# Patient Record
Sex: Female | Born: 1985 | Race: Black or African American | Hispanic: No | Marital: Single | Smoking: Current every day smoker
Health system: Southern US, Community
[De-identification: ages and names within clinical notes are randomized; demographics above are authoritative.]

## PROBLEM LIST (undated history)

## (undated) DIAGNOSIS — F419 Anxiety disorder, unspecified: Secondary | ICD-10-CM

---

## 2011-06-22 ENCOUNTER — Emergency Department (HOSPITAL_COMMUNITY)
Admission: EM | Admit: 2011-06-22 | Discharge: 2011-06-22 | Disposition: A | Discharge status: Home / Self Care | Payer: Self-pay | Attending: Emergency Medicine | Admitting: Emergency Medicine

## 2011-06-22 DIAGNOSIS — K006 Disturbances in tooth eruption: Secondary | ICD-10-CM | POA: Insufficient documentation

## 2011-06-22 DIAGNOSIS — K089 Disorder of teeth and supporting structures, unspecified: Secondary | ICD-10-CM | POA: Insufficient documentation

## 2012-01-27 ENCOUNTER — Emergency Department (HOSPITAL_COMMUNITY)
Admission: EM | Admit: 2012-01-27 | Discharge: 2012-01-27 | Disposition: A | Discharge status: Home / Self Care | Payer: BC Managed Care – PPO | Attending: Emergency Medicine | Admitting: Emergency Medicine

## 2012-01-27 ENCOUNTER — Encounter (HOSPITAL_COMMUNITY): Payer: Self-pay | Admitting: *Deleted

## 2012-01-27 ENCOUNTER — Emergency Department (HOSPITAL_COMMUNITY): Payer: BC Managed Care – PPO

## 2012-01-27 DIAGNOSIS — S63609A Unspecified sprain of unspecified thumb, initial encounter: Secondary | ICD-10-CM

## 2012-01-27 DIAGNOSIS — R296 Repeated falls: Secondary | ICD-10-CM | POA: Insufficient documentation

## 2012-01-27 DIAGNOSIS — M79609 Pain in unspecified limb: Secondary | ICD-10-CM | POA: Insufficient documentation

## 2012-01-27 DIAGNOSIS — S6390XA Sprain of unspecified part of unspecified wrist and hand, initial encounter: Secondary | ICD-10-CM | POA: Insufficient documentation

## 2012-01-27 MED ORDER — OXYCODONE-ACETAMINOPHEN 5-325 MG PO TABS
2.0000 | ORAL_TABLET | Freq: Once | ORAL | Status: AC
  Administered 2012-01-27: 2 via ORAL
  Filled 2012-01-27: qty 2

## 2012-01-27 MED ORDER — IBUPROFEN 800 MG PO TABS: 800.0000 mg | ORAL_TABLET | Freq: Three times a day (TID) | ORAL | Status: DC

## 2012-01-27 MED ORDER — OXYCODONE-ACETAMINOPHEN 5-325 MG PO TABS: 1.0000 | ORAL_TABLET | ORAL | Status: AC | PRN
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
## 2012-01-27 NOTE — Discharge Instructions (Signed)

## 2013-03-27 ENCOUNTER — Emergency Department (HOSPITAL_COMMUNITY)
Admission: EM | Admit: 2013-03-27 | Discharge: 2013-03-27 | Disposition: A | Discharge status: Home / Self Care | Payer: BC Managed Care – PPO | Attending: Emergency Medicine | Admitting: Emergency Medicine

## 2013-03-27 ENCOUNTER — Encounter (HOSPITAL_COMMUNITY): Payer: Self-pay | Admitting: Emergency Medicine

## 2013-03-27 DIAGNOSIS — Z8659 Personal history of other mental and behavioral disorders: Secondary | ICD-10-CM | POA: Insufficient documentation

## 2013-03-27 DIAGNOSIS — Z3202 Encounter for pregnancy test, result negative: Secondary | ICD-10-CM | POA: Insufficient documentation

## 2013-03-27 DIAGNOSIS — B9689 Other specified bacterial agents as the cause of diseases classified elsewhere: Secondary | ICD-10-CM

## 2013-03-27 DIAGNOSIS — R319 Hematuria, unspecified: Secondary | ICD-10-CM | POA: Insufficient documentation

## 2013-03-27 DIAGNOSIS — N760 Acute vaginitis: Secondary | ICD-10-CM | POA: Insufficient documentation

## 2013-03-27 DIAGNOSIS — N898 Other specified noninflammatory disorders of vagina: Secondary | ICD-10-CM | POA: Insufficient documentation

## 2013-03-27 DIAGNOSIS — R300 Dysuria: Secondary | ICD-10-CM | POA: Insufficient documentation

## 2013-03-27 DIAGNOSIS — R109 Unspecified abdominal pain: Secondary | ICD-10-CM | POA: Insufficient documentation

## 2013-03-27 DIAGNOSIS — N390 Urinary tract infection, site not specified: Secondary | ICD-10-CM | POA: Insufficient documentation

## 2013-03-27 HISTORY — DX: Anxiety disorder, unspecified: F41.9

## 2013-03-27 LAB — URINE MICROSCOPIC-ADD ON

## 2013-03-27 LAB — WET PREP, GENITAL
Trich, Wet Prep: NONE SEEN
Yeast Wet Prep HPF POC: NONE SEEN

## 2013-03-27 LAB — URINALYSIS, ROUTINE W REFLEX MICROSCOPIC
Bilirubin Urine: NEGATIVE
Glucose, UA: NEGATIVE mg/dL
Hgb urine dipstick: NEGATIVE
Nitrite: NEGATIVE
Specific Gravity, Urine: 1.029 (ref 1.005–1.030)
pH: 6 (ref 5.0–8.0)

## 2013-03-27 MED ORDER — METRONIDAZOLE 500 MG PO TABS: 500.0000 mg | ORAL_TABLET | Freq: Two times a day (BID) | ORAL | Status: DC

## 2013-03-27 MED ORDER — CEFTRIAXONE SODIUM 1 G IJ SOLR
1.0000 g | Freq: Once | INTRAMUSCULAR | Status: AC
  Administered 2013-03-27: 1 g via INTRAMUSCULAR
  Filled 2013-03-27: qty 10

## 2013-03-27 MED ORDER — OXYCODONE-ACETAMINOPHEN 5-325 MG PO TABS
2.0000 | ORAL_TABLET | Freq: Once | ORAL | Status: AC
  Administered 2013-03-27: 2 via ORAL
  Filled 2013-03-27: qty 2

## 2013-03-27 MED ORDER — HYDROCODONE-ACETAMINOPHEN 5-325 MG PO TABS: 1.0000 | ORAL_TABLET | ORAL | Status: DC | PRN

## 2013-03-27 MED ORDER — CEPHALEXIN 500 MG PO CAPS: 500.0000 mg | ORAL_CAPSULE | Freq: Four times a day (QID) | ORAL | Status: DC

## 2013-03-27 MED ORDER — LIDOCAINE HCL (PF) 1 % IJ SOLN
INTRAMUSCULAR | Status: AC
  Administered 2013-03-27: 5 mL
  Filled 2013-03-27: qty 5

## 2013-03-27 NOTE — ED Provider Notes (Signed)
CSN: [id number]     Arrival date & time 03/27/13  0865 History     First MD Initiated Contact with Patient 03/27/13 [phone]     Chief Complaint  Patient presents with  . Urinary Frequency   (Consider location/radiation/quality/duration/timing/severity/associated sxs/prior Treatment) HPI Vickie Lucas is a(n) 27 y.o. female who presents with chief complaint of dysuria, bilateral flank pain, suprapubic pain worsening over the past week.  The patient was treated for urinary tract infection with Cipro 2 months ago and states that her symptoms did not fully resolve.  she feels she complains of foul vaginal odor of the past week. Denies fevers, chills, myalgias, arthralgias. Denies DOE, SOB, chest tightness or pressure, radiation to left arm, jaw or back, or diaphoresis.. Denies headaches, light headedness, weakness, visual disturbances. Denies abdominal pain, nausea, vomiting, diarrhea or constipation.    Past Medical History  Diagnosis Date  . Anxiety    No past surgical history on file. No family history on file. History  Substance Use Topics  . Smoking status: Never Smoker   . Smokeless tobacco: Not on file  . Alcohol Use: No   OB History   Grav Para Term Preterm Abortions TAB SAB Ect Mult Living                 Review of Systems  Constitutional: Negative for fever and chills.  HENT: Negative for trouble swallowing.   Respiratory: Negative for shortness of breath.   Cardiovascular: Negative for chest pain.  Gastrointestinal: Negative for nausea, vomiting, abdominal pain, diarrhea and constipation.  Genitourinary: Positive for dysuria, hematuria, flank pain and vaginal discharge.  Musculoskeletal: Negative for myalgias and arthralgias.  Skin: Negative for rash.  Neurological: Negative for numbness.  All other systems reviewed and are negative.    Allergies  Ibuprofen  Home Medications  No current outpatient prescriptions on file. BP 132/86  Pulse 88  Temp(Src) 98.9 F  (37.2 C) (Oral)  Resp 18  SpO2 100%  LMP 02/25/2013 Physical Exam  Constitutional: She is oriented to person, place, and time. She appears well-developed and well-nourished. No distress.  HENT:  Head: Normocephalic and atraumatic.  Eyes: Conjunctivae are normal. No scleral icterus.  Neck: Normal range of motion.  Cardiovascular: Normal rate, regular rhythm and normal heart sounds.  Exam reveals no gallop and no friction rub.   No murmur heard. Pulmonary/Chest: Effort normal and breath sounds normal. No respiratory distress.  Abdominal: Soft. Bowel sounds are normal. She exhibits no distension and no mass. There is tenderness. There is no guarding.  Positive suprapubic and bilateral CVA tenderness  Genitourinary:  Pelvic exam: normal external genitalia, vulva, vagina, cervix, uterus and adnexa. Patient had generalized tenderness and thick, creamy vaginal discharge   Neurological: She is alert and oriented to person, place, and time.  Skin: Skin is warm and dry. She is not diaphoretic.    ED Course   Procedures (including critical care time)  Labs Reviewed  WET PREP, GENITAL - Abnormal; Notable for the following:    Clue Cells Wet Prep HPF POC MANY (*)    WBC, Wet Prep HPF POC MANY (*)    All other components within normal limits  URINALYSIS, ROUTINE W REFLEX MICROSCOPIC - Abnormal; Notable for the following:    APPearance CLOUDY (*)    Leukocytes, UA MODERATE (*)    All other components within normal limits  URINE MICROSCOPIC-ADD ON - Abnormal; Notable for the following:    Squamous Epithelial / LPF MANY (*)  Bacteria, UA FEW (*)    All other components within normal limits  GC/CHLAMYDIA PROBE AMP  URINE CULTURE  PREGNANCY, URINE   No results found. 1. UTI (lower urinary tract infection)   2. BV (bacterial vaginosis)     MDM  Pt has been diagnosed with a UTI and BV. Pt is afebrile, no CVA tenderness, normotensive, and denies N/V. Pt may have an early pyelonephritis,  however she is afebrile and does not appear toxic. Patient given IM rocephin, d/c with flagyl/ keflex.Pt to be dc home with antibiotics and instructions to follow up with PCP if symptoms persist.   Arthor Captain, PA-C 03/27/13 1155

## 2013-03-27 NOTE — ED Provider Notes (Signed)
Medical screening examination/treatment/procedure(s) were performed by non-physician practitioner and as supervising physician I was immediately available for consultation/collaboration.  Glynn Octave, MD 03/27/13 [phone]

## 2013-03-27 NOTE — ED Notes (Signed)
PT has hx of stubborn UTI's that do not respond to cipro. Reports her vagina smells funny, like ammonia. Most recent UTI was 2 mos ago. Pain with urination and frequency.

## 2013-03-27 NOTE — ED Notes (Signed)
Pt undressed and in a gown at this time 

## 2013-03-28 LAB — GC/CHLAMYDIA PROBE AMP: CT Probe RNA: NEGATIVE

## 2013-04-04 LAB — URINE CULTURE

## 2013-04-05 ENCOUNTER — Telehealth (HOSPITAL_COMMUNITY): Payer: Self-pay | Admitting: Emergency Medicine

## 2013-04-05 NOTE — ED Notes (Signed)
Post ED Visit - Positive Culture Follow-up  Culture report reviewed by antimicrobial stewardship pharmacist: []  Wes Dulaney, Pharm.D., BCPS []  Celedonio Miyamoto, [address].D., BCPS []  Georgina Pillion, [address].D., BCPS []  [place], [place].D., BCPS, AAHIVP [x]  Estella Husk, Pharm.D., BCPS, AAHIVP  Positive urine culture Treated with Keflex, organism sensitive to the same and no further patient follow-up is required at this time.  Kylie A Holland 04/05/2013, 5:03 PM

## 2013-04-14 ENCOUNTER — Emergency Department (HOSPITAL_COMMUNITY)
Admission: EM | Admit: 2013-04-14 | Discharge: 2013-04-14 | Disposition: A | Discharge status: Home / Self Care | Payer: BC Managed Care – PPO | Attending: Emergency Medicine | Admitting: Emergency Medicine

## 2013-04-14 ENCOUNTER — Encounter (HOSPITAL_COMMUNITY): Payer: Self-pay | Admitting: Emergency Medicine

## 2013-04-14 DIAGNOSIS — L293 Anogenital pruritus, unspecified: Secondary | ICD-10-CM | POA: Insufficient documentation

## 2013-04-14 DIAGNOSIS — Z8659 Personal history of other mental and behavioral disorders: Secondary | ICD-10-CM | POA: Insufficient documentation

## 2013-04-14 DIAGNOSIS — N898 Other specified noninflammatory disorders of vagina: Secondary | ICD-10-CM | POA: Insufficient documentation

## 2013-04-14 DIAGNOSIS — R300 Dysuria: Secondary | ICD-10-CM | POA: Insufficient documentation

## 2013-04-14 DIAGNOSIS — R197 Diarrhea, unspecified: Secondary | ICD-10-CM | POA: Insufficient documentation

## 2013-04-14 LAB — URINALYSIS, ROUTINE W REFLEX MICROSCOPIC
Ketones, ur: NEGATIVE mg/dL
Nitrite: NEGATIVE
Protein, ur: NEGATIVE mg/dL
pH: 5.5 (ref 5.0–8.0)

## 2013-04-14 LAB — WET PREP, GENITAL: Clue Cells Wet Prep HPF POC: NONE SEEN

## 2013-04-14 MED ORDER — FLUCONAZOLE 100 MG PO TABS
150.0000 mg | ORAL_TABLET | Freq: Once | ORAL | Status: AC
  Administered 2013-04-14: 150 mg via ORAL
  Filled 2013-04-14: qty 2

## 2013-04-14 MED ORDER — HYDROCODONE-ACETAMINOPHEN 5-325 MG PO TABS
1.0000 | ORAL_TABLET | Freq: Once | ORAL | Status: AC
  Administered 2013-04-14: 1 via ORAL
  Filled 2013-04-14: qty 1

## 2013-04-14 NOTE — ED Provider Notes (Signed)
CSN: [id number]     Arrival date & time 04/14/13  1134 History     First MD Initiated Contact with Patient 04/14/13 1155     Chief Complaint  Patient presents with  . Pelvic Pain   (Consider location/radiation/quality/duration/timing/severity/associated sxs/prior Treatment) HPI Comments: Patient is a 27 year old female past medical history significant for anxiety presented to the emergency department for vaginal itching and nonbloody diarrhea that began 3 days ago. Patient states she's seen 2 weeks ago for urinary tract infection and bacterial vaginosis at which time she was given antibiotics. Patient states she has been taking antibiotics as prescribed and feels as though her symptoms may be related to that, as patient states she is prone to get vaginal yeast infections on antibiotics. Patient verses some crampy lower abdominal pain prior to onset of diarrhea but otherwise no abdominal pain. She denies any fevers, chills, nausea, vomiting. Patient endorses using a Montistat vaginal suppository PTA.   Patient is a 27 y.o. female presenting with pelvic pain.  Pelvic Pain Pertinent negatives include no abdominal pain, chills, fever, nausea or vomiting.    Past Medical History  Diagnosis Date  . Anxiety    History reviewed. No pertinent past surgical history. No family history on file. History  Substance Use Topics  . Smoking status: Never Smoker   . Smokeless tobacco: Not on file  . Alcohol Use: No   OB History   Grav Para Term Preterm Abortions TAB SAB Ect Mult Living                 Review of Systems  Constitutional: Negative for fever and chills.  HENT: Negative.   Eyes: Negative.   Respiratory: Negative.   Cardiovascular: Negative.   Gastrointestinal: Positive for diarrhea. Negative for nausea, vomiting, abdominal pain, constipation, blood in stool, abdominal distention, anal bleeding and rectal pain.  Genitourinary: Positive for dysuria and vaginal discharge. Negative for  vaginal bleeding.       Vaginal itching  Skin: Negative.   Neurological: Negative.     Allergies  Ibuprofen  Home Medications   Current Outpatient Rx  Name  Route  Sig  Dispense  Refill  . cephALEXin (KEFLEX) 500 MG capsule   Oral   Take 500 mg by mouth 4 (four) times daily. Started on 03-27-13 - 10 day course         . PRESCRIPTION MEDICATION   Oral   Take 1 tablet by mouth daily. Birth Control Tablets         . Tioconazole (MONISTAT 1-DAY VA)   Vaginal   Place 1 application vaginally once.          BP 139/76  Pulse 60  Temp(Src) 97.8 F (36.6 C) (Oral)  Resp 16  SpO2 97%  LMP 02/25/2013 Physical Exam  Constitutional: She is oriented to person, place, and time. She appears well-developed and well-nourished. No distress.  HENT:  Head: Normocephalic and atraumatic.  Right Ear: External ear normal.  Left Ear: External ear normal.  Nose: Nose normal.  Mouth/Throat: Oropharynx is clear and moist.  Eyes: Conjunctivae are normal.  Neck: Neck supple.  Cardiovascular: Normal rate, regular rhythm, normal heart sounds and intact distal pulses.   Pulmonary/Chest: Effort normal and breath sounds normal.  Abdominal: Soft. Bowel sounds are normal. There is no tenderness.  Neurological: She is alert and oriented to person, place, and time.  Skin: Skin is warm and dry. She is not diaphoretic.   Exam performed by Silvestre Moment, Sherida Dobkins L,  exam chaperoned Date: 04/14/2013 Pelvic exam: normal external genitalia without evidence of trauma. VULVA: normal appearing vulva with no masses, tenderness or lesion. VAGINA: normal appearing vagina with normal color and discharge, no lesions. CERVIX: normal appearing cervix without lesions, cervical motion tenderness absent, cervical os closed with out purulent discharge; vaginal discharge - white and copious (likely monistat, but difficult to differentiate), Wet prep and DNA probe for chlamydia and GC obtained.   ADNEXA: normal adnexa  in size, nontender and no masses UTERUS: uterus is normal size, shape, consistency and nontender.   ED Course   Procedures (including critical care time)  Labs Reviewed  WET PREP, GENITAL - Abnormal; Notable for the following:    WBC, Wet Prep HPF POC FEW (*)    All other components within normal limits  URINALYSIS, ROUTINE W REFLEX MICROSCOPIC - Abnormal; Notable for the following:    APPearance CLOUDY (*)    Leukocytes, UA MODERATE (*)    All other components within normal limits  URINE MICROSCOPIC-ADD ON - Abnormal; Notable for the following:    Squamous Epithelial / LPF MANY (*)    Bacteria, UA FEW (*)    All other components within normal limits  GC/CHLAMYDIA PROBE AMP  URINE CULTURE   No results found. 1. Vaginal itching     MDM  Pt afebrile, AAOx4, non-toxic, NAD presenting for vaginal itching and diarrhea after starting Keflex and Flagyl for BV and UTI from previous ED visit a few weeks ago. Pelvic exam unremarkable, no CMT or adnexal fullness/tenderness. Abdomen s/nt/nd. Wet prep results trending towards infection resolution from previous ED visit. As patient is already on antibiotic regimen advised completion of current Abx course. Advised pt that diarrhea is a common side effect of Abx she is on. Diflucan given for vaginal complaint. Return precautions discussed. Patient is agreeable to plan. Patient d/w with Dr. Ranae Palms, agrees with plan. Patient is stable at time of discharge     Jeannetta Ellis, PA-C 04/14/13 1551

## 2013-04-14 NOTE — ED Notes (Signed)
Was seen 2 weeks ago for uti given meds which she has been taking and now she has very bad vag itching and diarrhea ? Reaction to meds

## 2013-04-15 LAB — URINE CULTURE

## 2013-04-15 LAB — GC/CHLAMYDIA PROBE AMP: GC Probe RNA: NEGATIVE

## 2013-04-15 NOTE — ED Provider Notes (Signed)
Medical screening examination/treatment/procedure(s) were performed by non-physician practitioner and as supervising physician I was immediately available for consultation/collaboration.   Loren Racer, MD 04/15/13 [phone]

## 2013-04-16 ENCOUNTER — Encounter (HOSPITAL_COMMUNITY): Payer: Self-pay | Admitting: Emergency Medicine

## 2013-04-16 ENCOUNTER — Emergency Department (HOSPITAL_COMMUNITY)
Admission: EM | Admit: 2013-04-16 | Discharge: 2013-04-16 | Disposition: A | Discharge status: Home / Self Care | Payer: BC Managed Care – PPO | Attending: Emergency Medicine | Admitting: Emergency Medicine

## 2013-04-16 DIAGNOSIS — N898 Other specified noninflammatory disorders of vagina: Secondary | ICD-10-CM | POA: Insufficient documentation

## 2013-04-16 DIAGNOSIS — L293 Anogenital pruritus, unspecified: Secondary | ICD-10-CM | POA: Insufficient documentation

## 2013-04-16 DIAGNOSIS — F411 Generalized anxiety disorder: Secondary | ICD-10-CM | POA: Insufficient documentation

## 2013-04-16 DIAGNOSIS — Z79899 Other long term (current) drug therapy: Secondary | ICD-10-CM | POA: Insufficient documentation

## 2013-04-16 MED ORDER — FLUCONAZOLE 200 MG PO TABS: 200.0000 mg | ORAL_TABLET | Freq: Once | ORAL | Status: AC

## 2013-04-16 MED ORDER — FLUCONAZOLE 100 MG PO TABS
200.0000 mg | ORAL_TABLET | Freq: Once | ORAL | Status: AC
  Administered 2013-04-16: 200 mg via ORAL
  Filled 2013-04-16: qty 2

## 2013-04-16 NOTE — ED Provider Notes (Signed)
Medical screening examination/treatment/procedure(s) were performed by non-physician practitioner and as supervising physician I was immediately available for consultation/collaboration.   Gilbert Narain, MD 04/16/13 1527 

## 2013-04-16 NOTE — ED Notes (Signed)
Pt was seen here for UTI and tx with abx. Immediately developed a yeast infection so came back and had a pelvic exam. Given meds for yeast infection which has NOT improved but is getting worse. "I just want to get rid of this yeast infection".

## 2013-04-16 NOTE — ED Notes (Signed)
Was seen here on the 18 for vag itching and sidee effects from med for uti states is no better

## 2013-04-16 NOTE — ED Provider Notes (Signed)
CSN: [id number]     Arrival date & time 04/16/13  1151 History     First MD Initiated Contact with Patient 04/16/13 1200     Chief Complaint  Patient presents with  . Medication Reaction   (Consider location/radiation/quality/duration/timing/severity/associated sxs/prior Treatment) The history is provided by the patient and medical records.   Patient presents to the ED for her continued vaginal itching and irritation. Patient states she was seen on 7/31 for UTI and BV-- given keflex and flagyl.  Developed a yeast infection from abx, seen on 8/18 and treated in the ED.  States sx improved initially but have now returned and are worse than before. Complains of vaginal itching, irritation, and clumpy white discharge.  Pt completed both abx courses and states UTI sx have resolved.  Pt states " i have already had 2 pelvic exams, i will not do another on.  i just need medicine to clear up this yeast infection".  No fevers, sweats, or chills.  No abdominal pain, N/V/D.  Pt does not have an OB-GYN at this time.   Past Medical History  Diagnosis Date  . Anxiety    History reviewed. No pertinent past surgical history. No family history on file. History  Substance Use Topics  . Smoking status: Never Smoker   . Smokeless tobacco: Not on file  . Alcohol Use: No   OB History   Grav Para Term Preterm Abortions TAB SAB Ect Mult Living                 Review of Systems  Genitourinary: Positive for vaginal discharge.  All other systems reviewed and are negative.    Allergies  Ibuprofen  Home Medications   Current Outpatient Rx  Name  Route  Sig  Dispense  Refill  . cephALEXin (KEFLEX) 500 MG capsule   Oral   Take 500 mg by mouth 4 (four) times daily. Started on 03-27-13 - 10 day course         . PRESCRIPTION MEDICATION   Oral   Take 1 tablet by mouth daily. Birth Control Tablets         . Tioconazole (MONISTAT 1-DAY VA)   Vaginal   Place 1 application vaginally once.          BP 149/72  Pulse 89  Temp(Src) 98.2 F (36.8 C) (Oral)  Resp 19  SpO2 97%  LMP 02/25/2013  Physical Exam  Nursing note and vitals reviewed. Constitutional: She is oriented to person, place, and time. She appears well-developed and well-nourished. No distress.  HENT:  Head: Normocephalic and atraumatic.  Mouth/Throat: Oropharynx is clear and moist.  Eyes: Conjunctivae and EOM are normal. Pupils are equal, round, and reactive to light.  Neck: Normal range of motion. Neck supple.  Cardiovascular: Normal rate, regular rhythm and normal heart sounds.   Pulmonary/Chest: Effort normal and breath sounds normal. No respiratory distress. She has no wheezes.  Abdominal: Soft. Bowel sounds are normal. There is no tenderness. There is no guarding.  Musculoskeletal: Normal range of motion.  Neurological: She is alert and oriented to person, place, and time.  Skin: Skin is warm and dry. She is not diaphoretic.  Psychiatric: She has a normal mood and affect.    ED Course   Procedures (including critical care time)  Labs Reviewed - No data to display No results found.  1. Vaginal itching     MDM   Pt refused pelvic exam.  Diflucan given in the ED-- Rx for  the same and instructed to repeat at 72 intervals until sx resolve.  [facility] if sx not resolved in 1 week.  Discussed plan with pt, she agreed.  Return precautions advised.  Garlon Hatchet, PA-C 04/16/13 1314

## 2019-09-02 ENCOUNTER — Encounter (HOSPITAL_BASED_OUTPATIENT_CLINIC_OR_DEPARTMENT_OTHER): Payer: Self-pay | Admitting: *Deleted

## 2019-09-02 ENCOUNTER — Other Ambulatory Visit: Payer: Self-pay

## 2019-09-02 ENCOUNTER — Emergency Department (HOSPITAL_BASED_OUTPATIENT_CLINIC_OR_DEPARTMENT_OTHER): Payer: No Typology Code available for payment source

## 2019-09-02 ENCOUNTER — Emergency Department (HOSPITAL_BASED_OUTPATIENT_CLINIC_OR_DEPARTMENT_OTHER)
Admission: EM | Admit: 2019-09-02 | Discharge: 2019-09-02 | Disposition: A | Discharge status: Home / Self Care | Payer: No Typology Code available for payment source | Attending: Emergency Medicine | Admitting: Emergency Medicine

## 2019-09-02 DIAGNOSIS — S46812A Strain of other muscles, fascia and tendons at shoulder and upper arm level, left arm, initial encounter: Secondary | ICD-10-CM | POA: Insufficient documentation

## 2019-09-02 DIAGNOSIS — F17210 Nicotine dependence, cigarettes, uncomplicated: Secondary | ICD-10-CM | POA: Insufficient documentation

## 2019-09-02 DIAGNOSIS — M25512 Pain in left shoulder: Secondary | ICD-10-CM

## 2019-09-02 DIAGNOSIS — S161XXA Strain of muscle, fascia and tendon at neck level, initial encounter: Secondary | ICD-10-CM | POA: Diagnosis not present

## 2019-09-02 DIAGNOSIS — Y9389 Activity, other specified: Secondary | ICD-10-CM | POA: Insufficient documentation

## 2019-09-02 DIAGNOSIS — Y998 Other external cause status: Secondary | ICD-10-CM | POA: Diagnosis not present

## 2019-09-02 DIAGNOSIS — Y92410 Unspecified street and highway as the place of occurrence of the external cause: Secondary | ICD-10-CM | POA: Diagnosis not present

## 2019-09-02 DIAGNOSIS — S199XXA Unspecified injury of neck, initial encounter: Secondary | ICD-10-CM | POA: Diagnosis present

## 2019-09-02 LAB — PREGNANCY, URINE: Preg Test, Ur: NEGATIVE

## 2019-09-02 MED ORDER — PREDNISONE 20 MG PO TABS: 40.0000 mg | ORAL_TABLET | Freq: Every day | ORAL | 0 refills | Status: AC

## 2019-09-02 MED ORDER — KETOROLAC TROMETHAMINE 60 MG/2ML IM SOLN
30.0000 mg | Freq: Once | INTRAMUSCULAR | Status: AC
  Administered 2019-09-02: 30 mg via INTRAMUSCULAR
  Filled 2019-09-02: qty 2

## 2019-09-02 MED ORDER — CYCLOBENZAPRINE HCL 10 MG PO TABS: 10.0000 mg | ORAL_TABLET | Freq: Every evening | ORAL | 0 refills | Status: AC | PRN

## 2019-09-02 MED ORDER — PREDNISONE 50 MG PO TABS
60.0000 mg | ORAL_TABLET | Freq: Once | ORAL | Status: AC
  Administered 2019-09-02: 20:00:00 60 mg via ORAL
  Filled 2019-09-02: qty 1
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
## 2019-09-02 NOTE — Discharge Instructions (Signed)
Continue Aleve and Tylenol as needed for the next week. Take this medicine with food. Take muscle relaxer at bedtime to help you sleep. This medicine makes you drowsy so do not take before driving or work Take Prednisone for possible pinched nerve Use a heating pad for sore muscles - use for 20 minutes several times a day Try gentle range of motion exercises Return for worsening symptoms
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
